# Patient Record
Sex: Male | Born: 1975 | Race: White | Hispanic: No
Health system: Southern US, Community
[De-identification: ages and names within clinical notes are randomized; demographics above are authoritative.]

---

## 2004-07-06 ENCOUNTER — Emergency Department: Payer: Self-pay | Admitting: Internal Medicine

## 2006-04-18 ENCOUNTER — Other Ambulatory Visit: Payer: Self-pay

## 2006-04-18 ENCOUNTER — Emergency Department: Payer: Self-pay | Admitting: Internal Medicine

## 2008-01-08 ENCOUNTER — Emergency Department: Payer: Self-pay | Admitting: Emergency Medicine

## 2008-01-09 ENCOUNTER — Emergency Department: Payer: Self-pay | Admitting: Emergency Medicine

## 2008-01-11 ENCOUNTER — Emergency Department: Payer: Self-pay | Admitting: Emergency Medicine

## 2009-01-10 ENCOUNTER — Emergency Department: Payer: Self-pay | Admitting: Emergency Medicine

## 2009-02-11 ENCOUNTER — Emergency Department: Payer: Self-pay | Admitting: Emergency Medicine

## 2009-08-01 ENCOUNTER — Emergency Department: Payer: Self-pay | Admitting: Emergency Medicine

## 2009-08-31 ENCOUNTER — Emergency Department: Payer: Self-pay | Admitting: Unknown Physician Specialty

## 2009-09-08 ENCOUNTER — Emergency Department: Payer: Self-pay | Admitting: Emergency Medicine

## 2009-12-18 ENCOUNTER — Emergency Department: Payer: Self-pay | Admitting: Internal Medicine

## 2009-12-19 ENCOUNTER — Emergency Department: Payer: Self-pay | Admitting: Emergency Medicine

## 2009-12-24 ENCOUNTER — Emergency Department: Payer: Self-pay | Admitting: Emergency Medicine

## 2009-12-26 ENCOUNTER — Emergency Department: Payer: Self-pay | Admitting: Emergency Medicine

## 2010-06-05 ENCOUNTER — Emergency Department: Payer: Self-pay | Admitting: Emergency Medicine

## 2010-06-16 ENCOUNTER — Ambulatory Visit: Payer: Self-pay | Admitting: Internal Medicine

## 2010-09-09 ENCOUNTER — Inpatient Hospital Stay: Payer: Self-pay | Admitting: Psychiatry

## 2010-09-14 ENCOUNTER — Emergency Department: Payer: Self-pay | Admitting: Emergency Medicine

## 2011-08-29 ENCOUNTER — Inpatient Hospital Stay: Payer: Self-pay | Admitting: Psychiatry

## 2011-08-29 LAB — ACETAMINOPHEN LEVEL: Acetaminophen: <2 ug/mL

## 2011-08-29 LAB — COMPREHENSIVE METABOLIC PANEL
Albumin: 4.3 g/dL (ref 3.4–5.0)
Alkaline Phosphatase: 87 U/L (ref 50–136)
Anion Gap: 12 (ref 7–16)
BUN: 6 mg/dL — ABNORMAL LOW (ref 7–18)
Co2: 27 mmol/L (ref 21–32)
EGFR (Non-African Amer.): >60
Glucose: 87 mg/dL (ref 65–99)
Osmolality: 278 (ref 275–301)
Potassium: 3.7 mmol/L (ref 3.5–5.1)
SGOT(AST): 28 U/L (ref 15–37)
SGPT (ALT): 38 U/L
Sodium: 141 mmol/L (ref 136–145)
Total Protein: 7.7 g/dL (ref 6.4–8.2)

## 2011-08-29 LAB — CBC
Platelet: 181 10*3/uL (ref 150–440)
RBC: 5.03 10*6/uL (ref 4.40–5.90)
WBC: 11.7 10*3/uL — ABNORMAL HIGH (ref 3.8–10.6)

## 2011-08-29 LAB — DRUG SCREEN, URINE
Barbiturates, Ur Screen: NEGATIVE (ref ?–200)
Cannabinoid 50 Ng, Ur ~~LOC~~: NEGATIVE (ref ?–50)
Cocaine Metabolite,Ur ~~LOC~~: POSITIVE (ref ?–300)
Opiate, Ur Screen: POSITIVE (ref ?–300)
Phencyclidine (PCP) Ur S: NEGATIVE (ref ?–25)
Tricyclic, Ur Screen: NEGATIVE (ref ?–1000)

## 2011-08-29 LAB — SALICYLATE LEVEL: Salicylates, Serum: 3.3 mg/dL — ABNORMAL HIGH

## 2011-08-29 LAB — TSH: Thyroid Stimulating Horm: 1.04 u[IU]/mL

## 2011-09-01 LAB — WBC: WBC: 8.9 10*3/uL (ref 3.8–10.6)

## 2011-09-01 LAB — FOLATE: Folic Acid: 22.8 ng/mL (ref 3.1–100.0)

## 2011-09-05 LAB — COMPREHENSIVE METABOLIC PANEL
Albumin: 4.1 g/dL (ref 3.4–5.0)
BUN: 9 mg/dL (ref 7–18)
Bilirubin,Total: 0.6 mg/dL (ref 0.2–1.0)
Chloride: 103 mmol/L (ref 98–107)
Co2: 29 mmol/L (ref 21–32)
EGFR (African American): >60
EGFR (Non-African Amer.): >60
Osmolality: 275 (ref 275–301)
SGPT (ALT): 22 U/L
Total Protein: 7.3 g/dL (ref 6.4–8.2)

## 2014-10-14 NOTE — H&P (Signed)
PATIENT NAME:  Chad Norton, Chad Norton MR#:  [id number] DATE OF BIRTH:  1975/12/07  DATE OF ADMISSION:  08/29/2011  IDENTIFYING INFORMATION:  The patient is a 39 year old white male not employed and last worked in November 2012 and drew unemployment until January 2013 at [employer]. The patient is separated since August 2012 and has been living here and there and anywhere and has no place.  The patient comes for inpatient hospitalization to psychiatry at [facility] with the chief complaint "feeling very depressed and nervous and having suicidal thoughts and walked myself here for admission".    HISTORY OF PRESENT ILLNESS: When the patient was asked when he last felt well, he reported "can't remember". He reports that the house is still there and the woman is still there and his child who is 27 years old is still there, but he cannot live there anymore because the woman that he has been living with for 10 years told him that she did not want to live with him anymore.  In addition to the stress, the patient had a nervous breakdown and was admitted to [facility] and after discharge he went back to work at [employer] he refused a drug screen and so they let him go, and he drew unemployment until January 2012.  Currently he has no money coming in, no job, and financial stress and no place to stay which caused him to be depressed and have suicidal thoughts.    PAST PSYCHIATRIC HISTORY: The patient has been to admitted to psychiatry out three or four times.  First inpatient hospitalization to psychiatry was at [facility] in March 2012.  According to the information obtained from the charts, he presented voluntarily on his own with suicidal thoughts that he was going to hurt himself. He denied any specific plan.  He was an inpatient at [facility] once.  He was given a followup appointment and he went to see a doctor at [facility]  and saw him only once and felt that he was not getting enough help and so he quit going.  He is not taking medications as prescribed.    FAMILY HISTORY OF MENTAL ILLNESS: None known for mental illness and no known history of suicides in the family.   FAMILY HISTORY: He was raised by his father and step-mother until age 39 years and then he raised himself here and there.  Parents were divorced when the patient was 66 or 39 years old.  He does not know much about his mother. Father is living and he works for a [employer] and he is 39 years old.  He has one step-brother and one half sister, not close to family.  PERSONAL HISTORY:  He was born in [place].  He dropped out in 10th grade because he did not have a place to stay, but he got his GED later. He took some college courses but has no degree.    WORK HISTORY: His first job was at age 43 years.  He worked in [employer] for many years.  He last worked for [employer] and was let go when he refused a drug screen after two years of working there.    MARRIAGES: He admits that he has been living with a woman for 10 years who did not want him anymore.  He has three children, two from a previous marriage, and they are 85 and 8 years old, he paid child support  until November 2012. He has a 39 year old child from his girlfriend of 10 years.    LEGAL HISTORY: None reported.  ALCOHOL AND DRUGS:  No problems with alcohol drinking. He does admit he started snorting cocaine, as much as he can get, after he saw his wife cheating with a man that he works with. He last snorted it 08/28/2010 and so he refused his urine drug screen when he went back to his job at [employer].  He does admit smoking nicotine cigarettes, two packs a day for many years.    MEDICAL HISTORY:  No known history of high blood pressure. No known history of diabetes mellitus.  No major surgeries. No major injuries. No major motor vehicle accidents. He has never been  unconscious. He is allergic to doxycycline.  He is being followed by Dr. Vonita MossMark Crissman, in a family practice. His last appointment was some time ago. His next appointment is to be made.    PHYSICAL EXAMINATION:    VITALS: Temperature 97.4, pulse 74 per minute and regular, respirations 18 per minute and regular, blood pressure 126/70 mmHg.  HEENT: Head is normocephalic, atraumatic. Pupils are equally round and reactive to light. Fundi are bilaterally benign. Extraocular movements are visualized. Tympanic membranes visualized, no exudates.   NECK: Supple without any organomegaly, lymphadenopathy or thyromegaly.   CHEST: Normal expansion. Normal breath sounds heard.   HEART: Normal S1 and S2 without any murmurs or gallops.   ABDOMEN: Soft, no organomegaly. Bowel sounds heard.   RECTAL: Examination is deferred.  EXTREMITIES: 2+ pedal pulses bilaterally. No rashes, cyanosis, clubbing or edema.   NEURO: Gait is normal. Romberg is negative. Cranial nerves II through XII grossly intact and normal. Deep tendon reflexes 2+ and normal. Plantars have normal response.   MENTAL STATUS EXAMINATION: The patient is dressed in street clothes, fully alert and oriented to place, person, and time and the reason for his admission to [facility].  The patient kept on saying "I am not crazy. All I need is help. I feel hopeless. I feel helpless. I need help."  He became teary-eyed while talking about his problems of losing his job and financial stress and his girlfriend of 10 years not wanting him anymore and him not having a place.  He does admit feeling hopeless and helpless, worthless and useless. He does admit to suicidal wishes and thoughts but no active plan and he realizes that he needs help. No evidence of psychosis. He repeatedly went on saying "I'm not crazy". He does not appear to be responding to internal stimuli. Thought process is logical and goal directed.  He could spell the word world forward and backward  without any problem. Abstract and interpretation is good. General knowledge information is good. Abstraction and interpretation is good.  He does admit to appetite and sleep disturbance and not able to work because of feeling depressed. Insight and judgment fair.    IMPRESSION:  AXIS I:  1. Major depressive disorder, recurrent, moderate with suicidal ideas, but contracts for safety. 2. Cocaine abuse.  3. Nicotine dependence.   AXIS II: Deferred.  AXIS III: No major medical problems.  AXIS IV: Severe - financial, loss of job, relationship conflicts, comorbid substance abuse with cocaine.  AXIS V: GAF 25.  PLAN:  The patient is admitted to [facility] for closer observation, management, and help. He will be started back on antidepressant medications. During the stay in the hospital, he will be given milieu therapy and supportive  counseling. He will take part in individual and group therapy where substance abuse will also be stressed. During the stay in the hospital, he will be given help with support and coping skills and appropriate substance abuse problems will be addressed. At the time of discharge, his depression will be under control and appropriate followup appointments will be made.  ____________________________ Jannet Mantis. Guss Bunde, MD skc:slb D: 08/30/2011 16:40:20 ET T: 08/31/2011 06:23:23 ET JOB#: [id number]  cc: Monika Salk K. Guss Bunde, MD, <Dictator> Beau Fanny MD ELECTRONICALLY SIGNED 09/05/2011 18:18

## 2020-02-12 ENCOUNTER — Encounter (HOSPITAL_COMMUNITY): Payer: Self-pay

## 2020-02-12 ENCOUNTER — Emergency Department (HOSPITAL_COMMUNITY): Payer: BLUE CROSS/BLUE SHIELD

## 2020-02-12 ENCOUNTER — Emergency Department (HOSPITAL_COMMUNITY)
Admission: EM | Admit: 2020-02-12 | Discharge: 2020-02-12 | Disposition: A | Discharge status: Home / Self Care | Payer: BLUE CROSS/BLUE SHIELD | Attending: Emergency Medicine | Admitting: Emergency Medicine

## 2020-02-12 ENCOUNTER — Other Ambulatory Visit: Payer: Self-pay

## 2020-02-12 DIAGNOSIS — S81811A Laceration without foreign body, right lower leg, initial encounter: Secondary | ICD-10-CM | POA: Insufficient documentation

## 2020-02-12 DIAGNOSIS — Y990 Civilian activity done for income or pay: Secondary | ICD-10-CM | POA: Diagnosis not present

## 2020-02-12 DIAGNOSIS — Y929 Unspecified place or not applicable: Secondary | ICD-10-CM | POA: Insufficient documentation

## 2020-02-12 DIAGNOSIS — Y939 Activity, unspecified: Secondary | ICD-10-CM | POA: Diagnosis not present

## 2020-02-12 DIAGNOSIS — M791 Myalgia, unspecified site: Secondary | ICD-10-CM | POA: Diagnosis not present

## 2020-02-12 DIAGNOSIS — W311XXA Contact with metalworking machines, initial encounter: Secondary | ICD-10-CM | POA: Insufficient documentation

## 2020-02-12 DIAGNOSIS — S8991XA Unspecified injury of right lower leg, initial encounter: Secondary | ICD-10-CM | POA: Diagnosis present

## 2020-02-12 MED ORDER — OXYCODONE-ACETAMINOPHEN 5-325 MG PO TABS
1.0000 | ORAL_TABLET | Freq: Once | ORAL | Status: AC
  Administered 2020-02-12: 1 via ORAL
  Filled 2020-02-12: qty 1

## 2020-02-12 MED ORDER — LIDOCAINE-EPINEPHRINE 1 %-1:100000 IJ SOLN
20.0000 mL | Freq: Once | INTRAMUSCULAR | Status: AC
  Administered 2020-02-12: 20 mL
  Filled 2020-02-12: qty 1

## 2020-02-12 MED ORDER — IBUPROFEN 800 MG PO TABS
800.0000 mg | ORAL_TABLET | Freq: Once | ORAL | Status: AC
  Administered 2020-02-12: 800 mg via ORAL
  Filled 2020-02-12: qty 1

## 2020-02-12 MED ORDER — NAPROXEN 500 MG PO TABS: 500.0000 mg | ORAL_TABLET | Freq: Two times a day (BID) | ORAL | 0 refills | Status: AC | PRN

## 2020-02-12 NOTE — ED Provider Notes (Addendum)
[facility] EMERGENCY DEPARTMENT Provider Note   CSN: [id number] Arrival date & time: 02/12/20  6195     History Chief Complaint  Patient presents with  . Laceration    Chad Norton is a 44 y.o. male without significant past medical history who presents to the emergency department with complaints of right lower leg injury which occurred shortly prior to arrival.  Patient states he was working with stone when he hit his right lower leg with a piece of it resulting in a laceration.  He states the area is painful, worse with palpation/movement, no alleviating factors.  He states that it bled for 20 to 30 minutes.  His last tetanus was in the past 5 years.  He denies any other areas of injury, numbness, tingling, or weakness.  HPI     History reviewed. No pertinent past medical history.  There are no problems to display for this patient.   History reviewed. No pertinent surgical history.     No family history on file.  Social History   Tobacco Use  . Smoking status: Not on file  Substance Use Topics  . Alcohol use: Not on file  . Drug use: Not on file    Home Medications Prior to Admission medications   Not on File    Allergies    Patient has no known allergies.  Review of Systems   Review of Systems  Constitutional: Negative for chills and fever.  Respiratory: Negative for shortness of breath.   Cardiovascular: Negative for chest pain.  Gastrointestinal: Negative for abdominal pain.  Musculoskeletal: Positive for myalgias.  Skin: Positive for wound.  Neurological: Negative for weakness and numbness.  All other systems reviewed and are negative.   Physical Exam Updated Vital Signs BP (!) 143/84 (BP Location: Left Arm)   Pulse 88   Temp 98.5 F (36.9 C) (Oral)   Resp 18   Ht 6' (1.829 m)   Wt 96.2 kg   SpO2 97%   BMI 28.75 kg/m   Physical Exam Vitals and nursing note reviewed.  Constitutional:      General: He is not in acute  distress.    Appearance: He is well-developed. He is not ill-appearing or toxic-appearing.  HENT:     Head: Normocephalic and atraumatic.  Eyes:     General:        Right eye: No discharge.        Left eye: No discharge.     Conjunctiva/sclera: Conjunctivae normal.  Cardiovascular:     Rate and Rhythm: Normal rate and regular rhythm.     Pulses:          Dorsalis pedis pulses are 2+ on the right side and 2+ on the left side.       Posterior tibial pulses are 2+ on the right side and 2+ on the left side.  Pulmonary:     Effort: Pulmonary effort is normal. No respiratory distress.     Breath sounds: Normal breath sounds. No wheezing, rhonchi or rales.  Abdominal:     General: There is no distension.     Palpations: Abdomen is soft.     Tenderness: There is no abdominal tenderness.  Musculoskeletal:     Cervical back: Neck supple.     Comments: Lower extremities:  RLE: There is a 2.5 cm laceration that is mildly curved to the mid anterior lower leg that is approximately 3 mm deep, inferior to this there is an abrasion/skin tear,  and more inferior there is a 6 cm J shaped laceration that is 4 mm deep. No active bleeding or appreciable FBs to lacerations. Patient has intact AROM to bilateral hips, knees, ankles, and all digits. Tender to palpation to the distal 2/3rds of the anterior lower leg. Otherwise no significant tenderness. Compartments are soft.   Skin:    General: Skin is warm and dry.     Capillary Refill: Capillary refill takes less than 2 seconds.     Findings: No rash.  Neurological:     Mental Status: He is alert.     Comments: Alert. Clear speech. Sensation grossly intact to bilateral lower extremities. 5/5 strength with plantar/dorsiflexion bilaterally.   Psychiatric:        Mood and Affect: Mood normal.        Behavior: Behavior normal.     ED Results / Procedures / Treatments   Labs (all labs ordered are listed, but only abnormal results are displayed) Labs  Reviewed - No data to display  EKG None  Radiology No results found.  Procedures .Marland KitchenLaceration Repair  Date/Time: 02/12/2020 10:20 AM Performed by: Cherly Anderson, PA-C Authorized by: Cherly Anderson, PA-C   Consent:    Consent obtained:  Verbal   Consent given by:  Patient   Risks discussed:  Infection, need for additional repair, nerve damage, poor wound healing, poor cosmetic result, pain, retained foreign body, tendon damage and vascular damage   Alternatives discussed:  No treatment Anesthesia (see MAR for exact dosages):    Anesthesia method:  Local infiltration   Local anesthetic:  Lidocaine 2% WITH epi Laceration details:    Location:  Leg   Leg location:  R lower leg   Length (cm):  2.5   Depth (mm):  3 Repair type:    Repair type:  Simple Pre-procedure details:    Preparation:  Patient was prepped and draped in usual sterile fashion and imaging obtained to evaluate for foreign bodies Exploration:    Hemostasis achieved with:  Direct pressure   Wound exploration: wound explored through full range of motion and entire depth of wound probed and visualized     Contaminated: no   Treatment:    Area cleansed with:  Betadine   Amount of cleaning:  Standard   Irrigation solution:  Sterile water   Irrigation method:  Pressure wash Skin repair:    Repair method:  Sutures   Suture size:  4-0   Suture material:  Nylon   Suture technique:  Simple interrupted   Number of sutures:  3 Approximation:    Approximation:  Close Post-procedure details:    Dressing:  Antibiotic ointment   Patient tolerance of procedure:  Tolerated well, no immediate complications .Marland KitchenLaceration Repair  Date/Time: 02/12/2020 10:21 AM Performed by: Cherly Anderson, PA-C Authorized by: Cherly Anderson, PA-C   Consent:    Consent obtained:  Verbal   Consent given by:  Patient   Risks discussed:  Infection, need for additional repair, nerve damage, poor wound  healing, poor cosmetic result, pain, retained foreign body, tendon damage and vascular damage   Alternatives discussed:  No treatment Anesthesia (see MAR for exact dosages):    Anesthesia method:  Local infiltration   Local anesthetic:  Lidocaine 2% WITH epi Laceration details:    Location:  Leg   Leg location:  R lower leg   Length (cm):  6   Depth (mm):  4 Repair type:    Repair type:  Simple  Pre-procedure details:    Preparation:  Patient was prepped and draped in usual sterile fashion and imaging obtained to evaluate for foreign bodies Exploration:    Hemostasis achieved with:  Direct pressure   Wound exploration: wound explored through full range of motion and entire depth of wound probed and visualized     Contaminated: no   Treatment:    Area cleansed with:  Betadine   Amount of cleaning:  Standard   Irrigation solution:  Sterile water   Irrigation method:  Pressure wash Skin repair:    Repair method:  Sutures   Suture size:  4-0   Suture material:  Nylon   Suture technique:  Simple interrupted   Number of sutures:  9 Approximation:    Approximation:  Close Post-procedure details:    Dressing:  Antibiotic ointment   Patient tolerance of procedure:  Tolerated well, no immediate complications   (including critical care time)  Medications Ordered in ED Medications  lidocaine-EPINEPHrine (XYLOCAINE W/EPI) 1 %-1:100000 (with pres) injection 20 mL (20 mLs Infiltration Given by Other 02/12/20 0904)  oxyCODONE-acetaminophen (PERCOCET/ROXICET) 5-325 MG per tablet 1 tablet (1 tablet Oral Given 02/12/20 0737)    ED Course  I have reviewed the triage vital signs and the nursing notes.  Pertinent labs & imaging results that were available during my care of the patient were reviewed by me and considered in my medical decision making (see chart for details).    MDM Rules/Calculators/A&P                         Patient presents to the emergency department for evaluation of  right lower leg injury which occurred just PTA. Patient nontoxic appearing, resting comfortably. X-ray obtained in area of lacerations, no fractures/dislocations or apparent radiopaque foreign bodies. Pressure irrigation performed. Wounds explored and bases of wounds visualized in a bloodless field without evidence of foreign body. Laceration repairs per procedure notes above, tolerated well, abrasion/skin tear in between lacerations does not appear amenable to closure. Tetanus is up to date. NVI distally prior to and following procedure. Discussed suture home care as well as need for wound recheck and suture removal in 10 days.  I discussed results, treatment plan, need for follow-up, and return precautions with the patient including signs of infection. Provided opportunity for questions, patient confirmed understanding and is in agreement with plan.    Final Clinical Impression(s) / ED Diagnoses Final diagnoses:  Laceration of right lower leg, initial encounter    Rx / DC Orders ED Discharge Orders         Ordered    naproxen (NAPROSYN) 500 MG tablet  2 times daily PRN        02/12/20 [address] [place] R, PA-C 02/12/20 1029    [address], [place], PA-C 02/12/20 1029    Pricilla Loveless, MD 02/12/20 1521

## 2020-02-12 NOTE — ED Triage Notes (Signed)
Patient with large right lower leg laceration after cutting same on granite remnant. Active bleeding and appears full thickness. Initial dressing applied on assessment. Alert and oriented

## 2020-02-12 NOTE — Discharge Instructions (Signed)
You were seen in the emergency department today for lacerations in your lower leg.  Your xray did not show any fractures/dislocations. One was closed with 3 stitches the other was closed with 9 stitches. Please keep this area clean and dry for the next 24 hours, after 24 hours you may get this area wet, but avoid soaking the area. Keep the area covered as best possible especially when in the sun to help in minimizing scarring.   We are sending you home with naproxen to help with pain.  - Naproxen is a nonsteroidal anti-inflammatory medication that will help with pain and swelling. Be sure to take this medication as prescribed with food, 1 pill every 12 hours,  It should be taken with food, as it can cause stomach upset, and more seriously, stomach bleeding. Do not take other nonsteroidal anti-inflammatory medications with this such as Advil, Motrin, Aleve, Mobic, Goodie Powder, or Motrin.    You make take Tylenol per over the counter dosing with these medications.   We have prescribed you new medication(s) today. Discuss the medications prescribed today with your pharmacist as they can have adverse effects and interactions with your other medicines including over the counter and prescribed medications. Seek medical evaluation if you start to experience new or abnormal symptoms after taking one of these medicines, seek care immediately if you start to experience difficulty breathing, feeling of your throat closing, facial swelling, or rash as these could be indications of a more serious allergic reaction  You will need to have the stitches removed and the wound rechecked in 8-10 days. Please return to the emergency department, go to an urgent care, or see your primary care provider to have this performed. Return to the ER soon should you start to experience pus type drainage from the wound, redness around the wound, or fevers as this could indicate the area is infected, please return to the ER for any other  worsening symptoms or concerns that you may have.

## 2021-11-16 IMAGING — CR DG TIBIA/FIBULA 2V*R*
4 series · 4 of 4 positions shown · non-contrast
Comparison: None.

CLINICAL DATA: Injury after laceration

EXAM:
RIGHT TIBIA AND FIBULA - 2 VIEW

[tibia ap (1 of 2)]
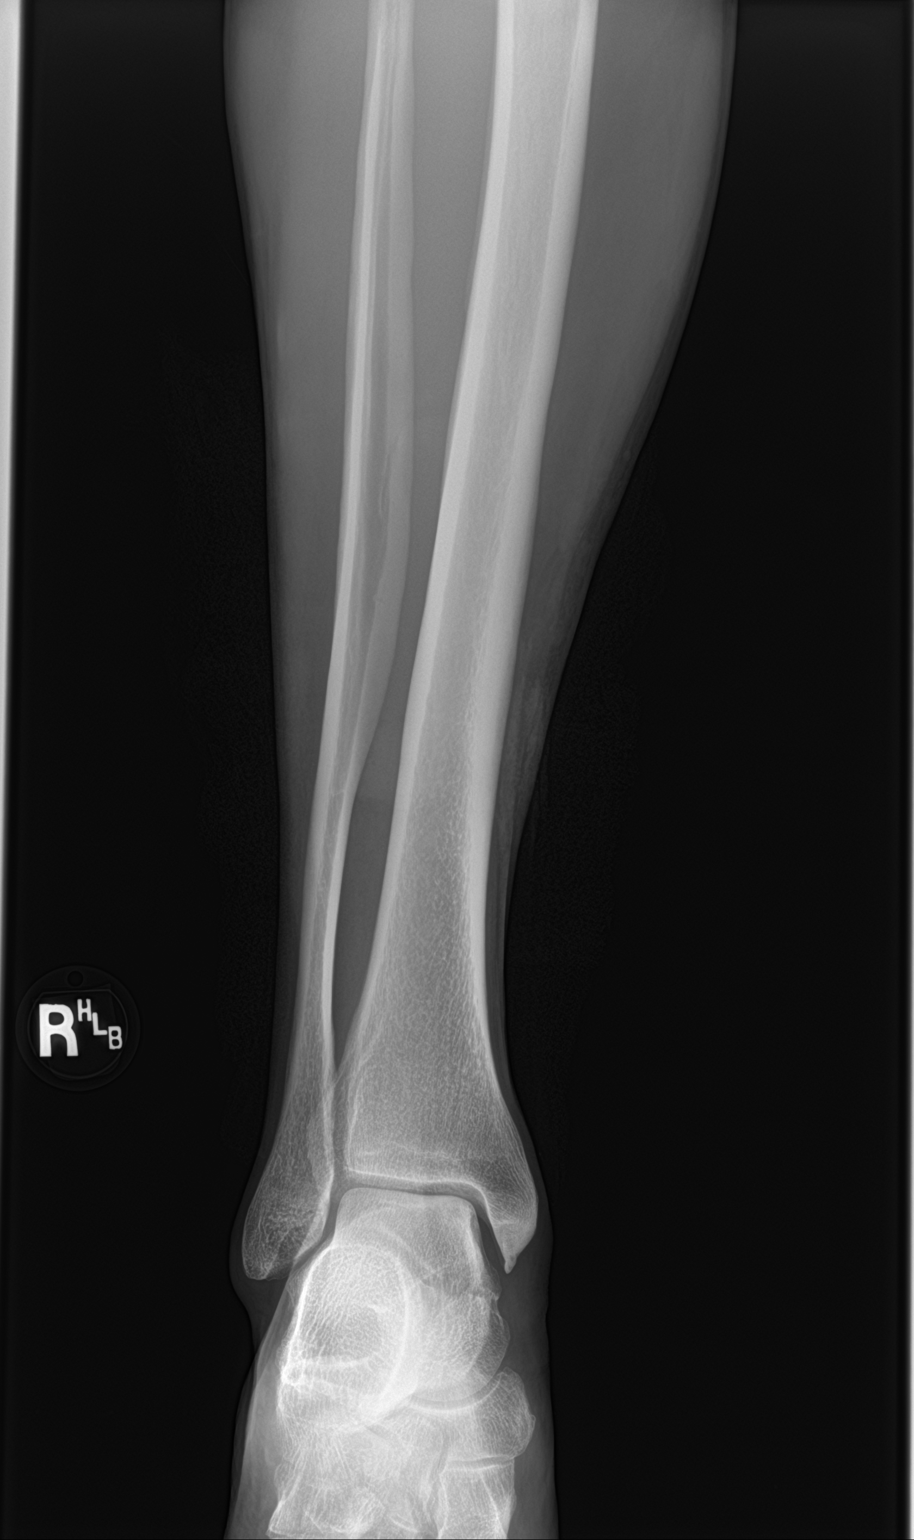

[tibia ap (2 of 2)]
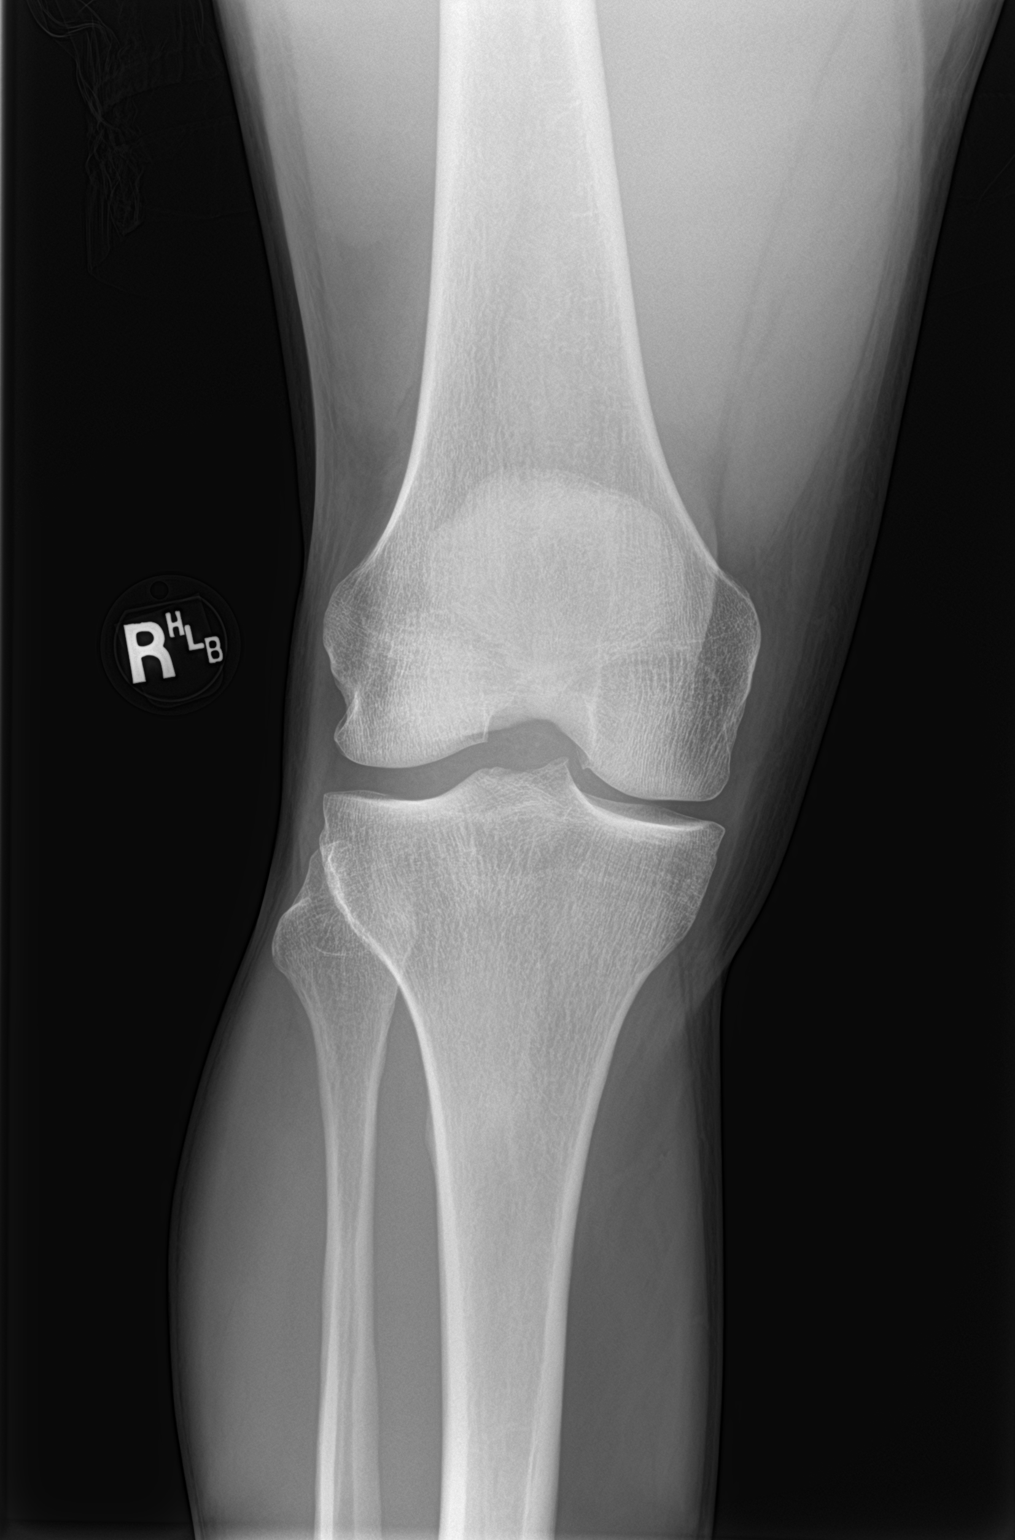

[tibia lat (1 of 2)]
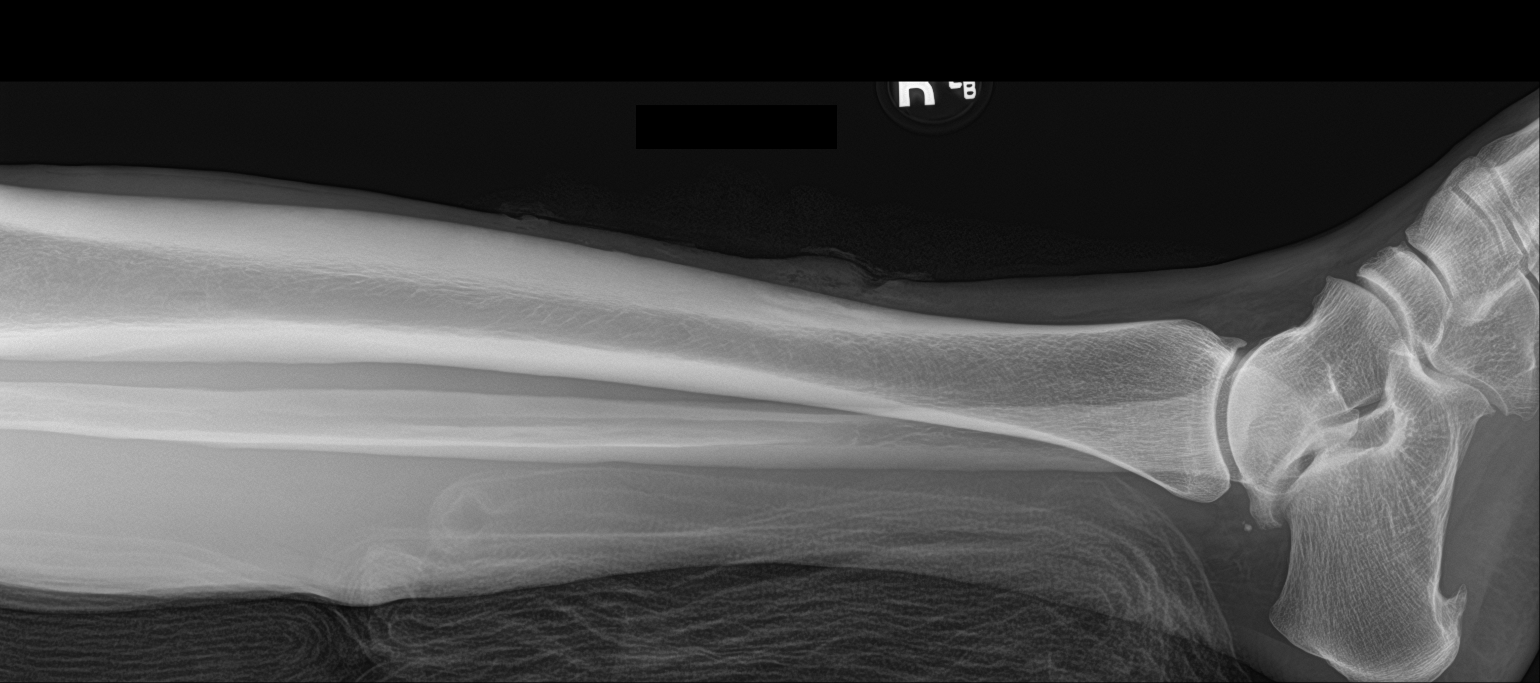

[tibia lat (2 of 2)]
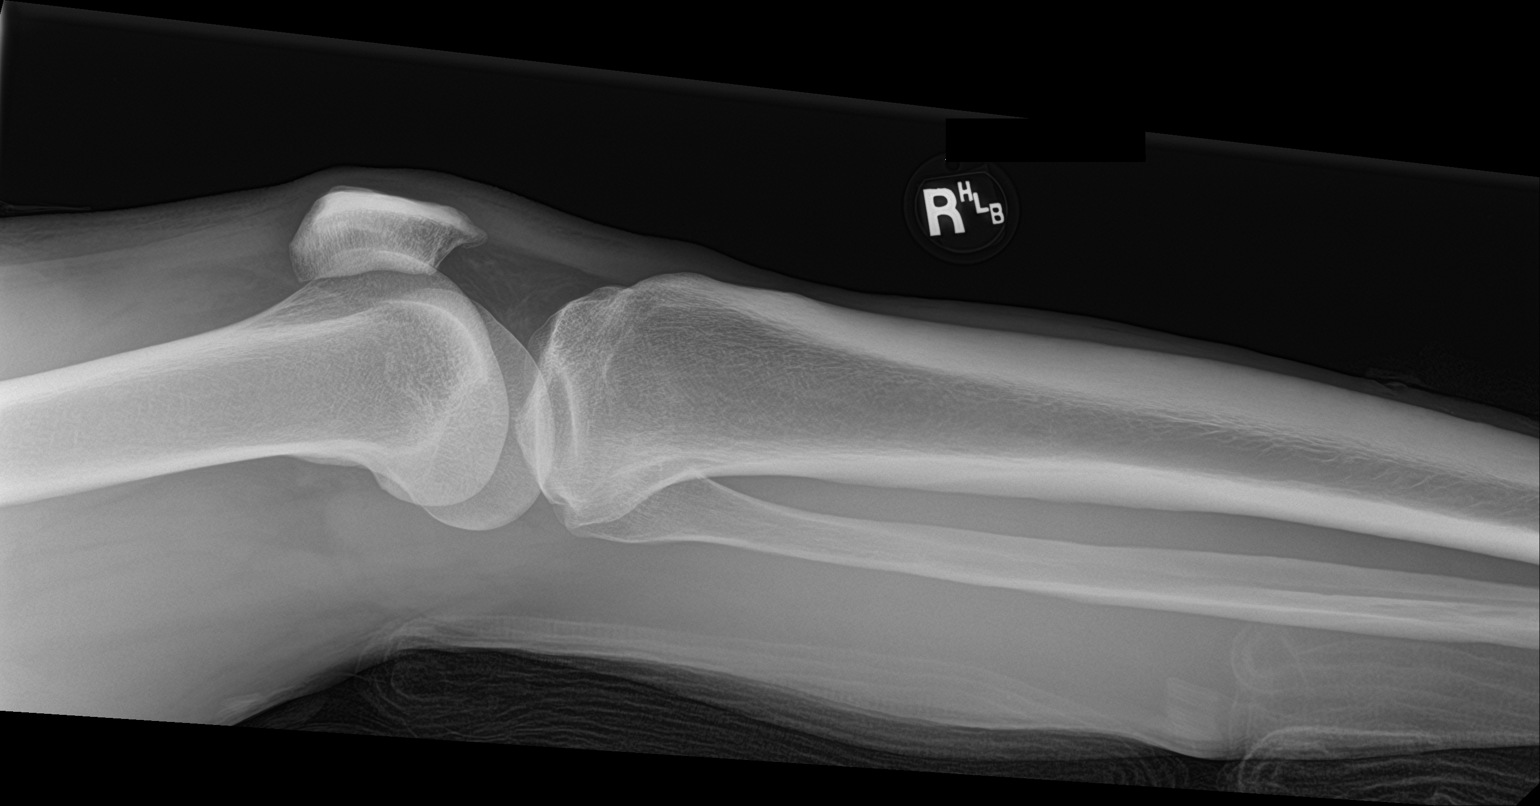

[4 of 4 positions shown; findings below may reference images not displayed]

FINDINGS: Frontal and lateral views were obtained. No fracture or dislocation.
Soft tissue injury anterior to the distal tibia without radiopaque
foreign body. No appreciable abnormal periosteal reaction. Joint
spaces appear normal. There is an inferior calcaneal spur.
IMPRESSION: Soft tissue injury anterior to the distal tibia. No fracture or
dislocation. No appreciable arthropathy. There is an inferior
calcaneal spur. No radiopaque foreign body in the area of soft
tissue injury.
